# Patient Record
Sex: Male | Born: 1955 | Marital: Married | Smoking: Never smoker
Health system: Southern US, Community
[De-identification: ages and names within clinical notes are randomized; demographics above are authoritative.]

## PROBLEM LIST (undated history)

## (undated) DIAGNOSIS — I10 Essential (primary) hypertension: Secondary | ICD-10-CM

## (undated) HISTORY — PX: OTHER SURGICAL HISTORY: SHX169

---

## 2011-08-18 ENCOUNTER — Ambulatory Visit: Payer: Self-pay | Admitting: Internal Medicine

## 2012-11-26 IMAGING — CR DG CHEST 2V
1 series · 2 of 2 positions shown · non-contrast
Comparison: none

REASON FOR EXAM: posterior thoracic pain
COMMENTS:   LMP: (Male)

PROCEDURE:     MDR - MDR CHEST PA(OR AP) AND LATERAL  - August 18, 2011 [DATE]
RESULT:     The lungs are clear. The cardiac silhouette and visualized bony
skeleton are unremarkable.

[Series 1: pa · 0.17mm/px · 2 of 2 slices shown]
[im 1/2]
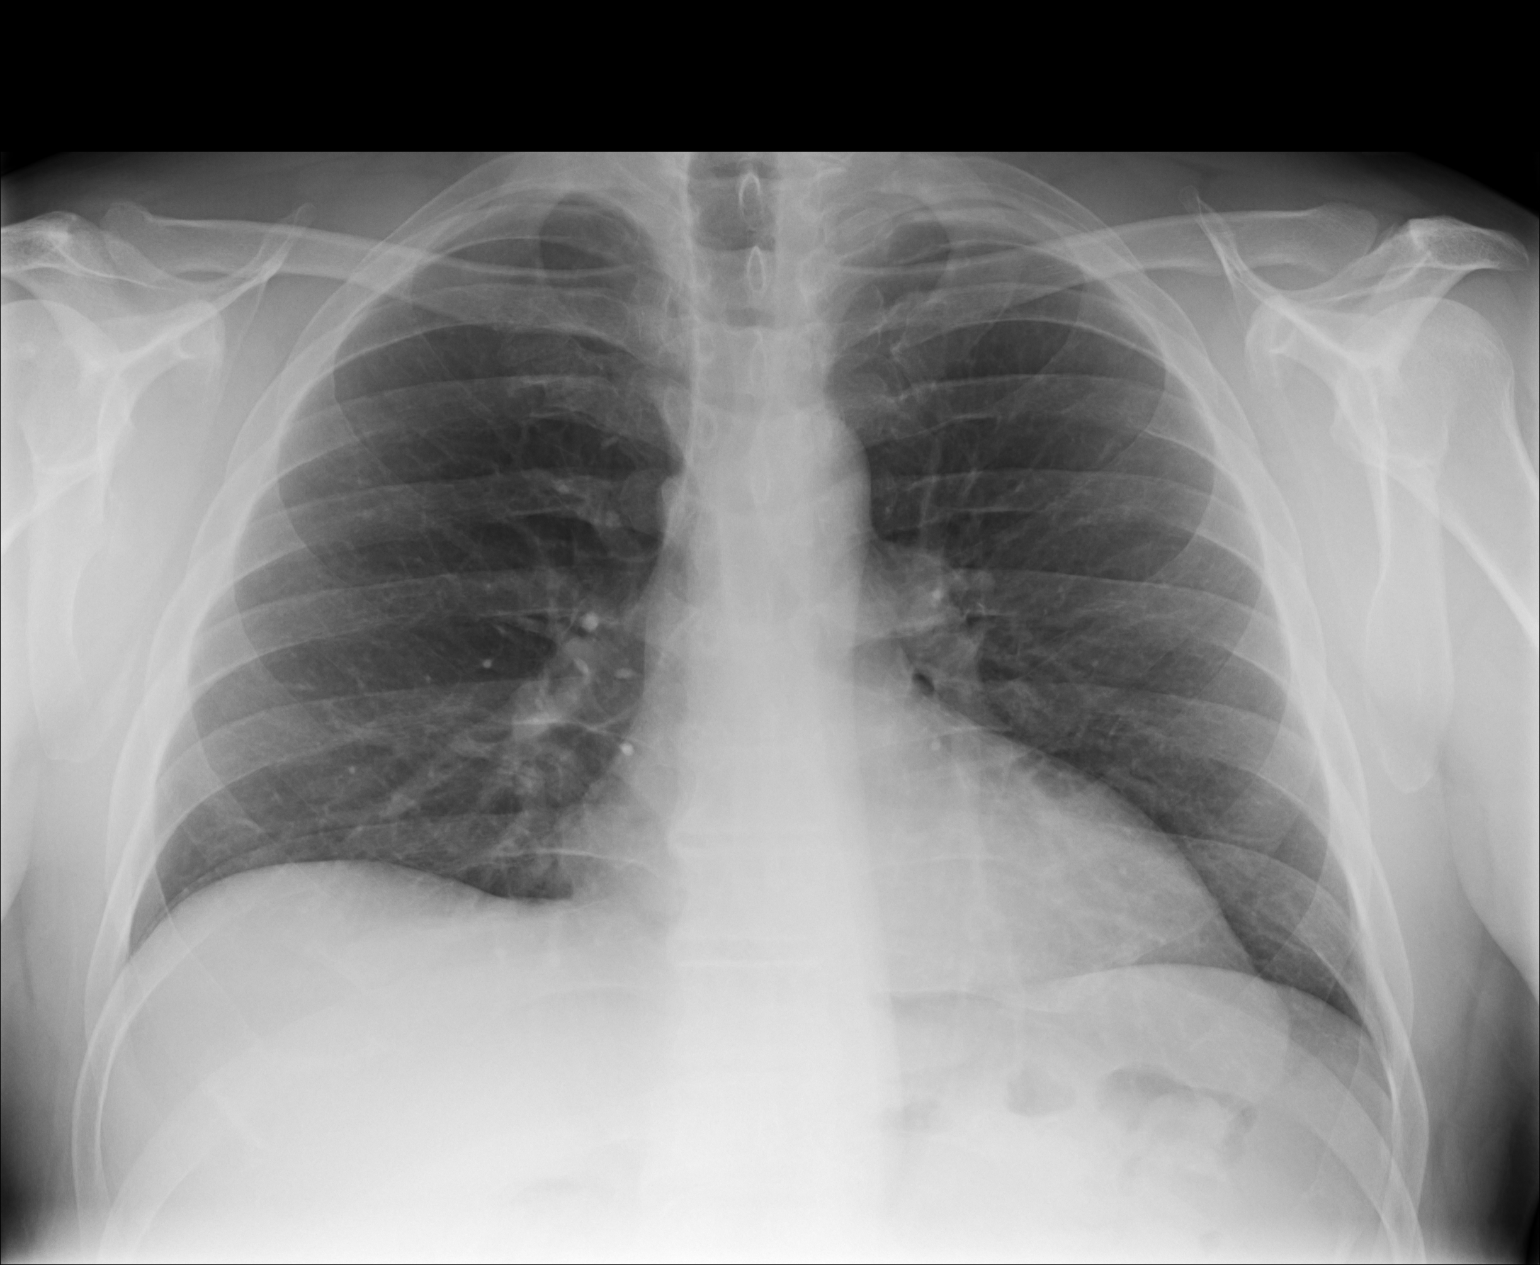
[im 2/2]
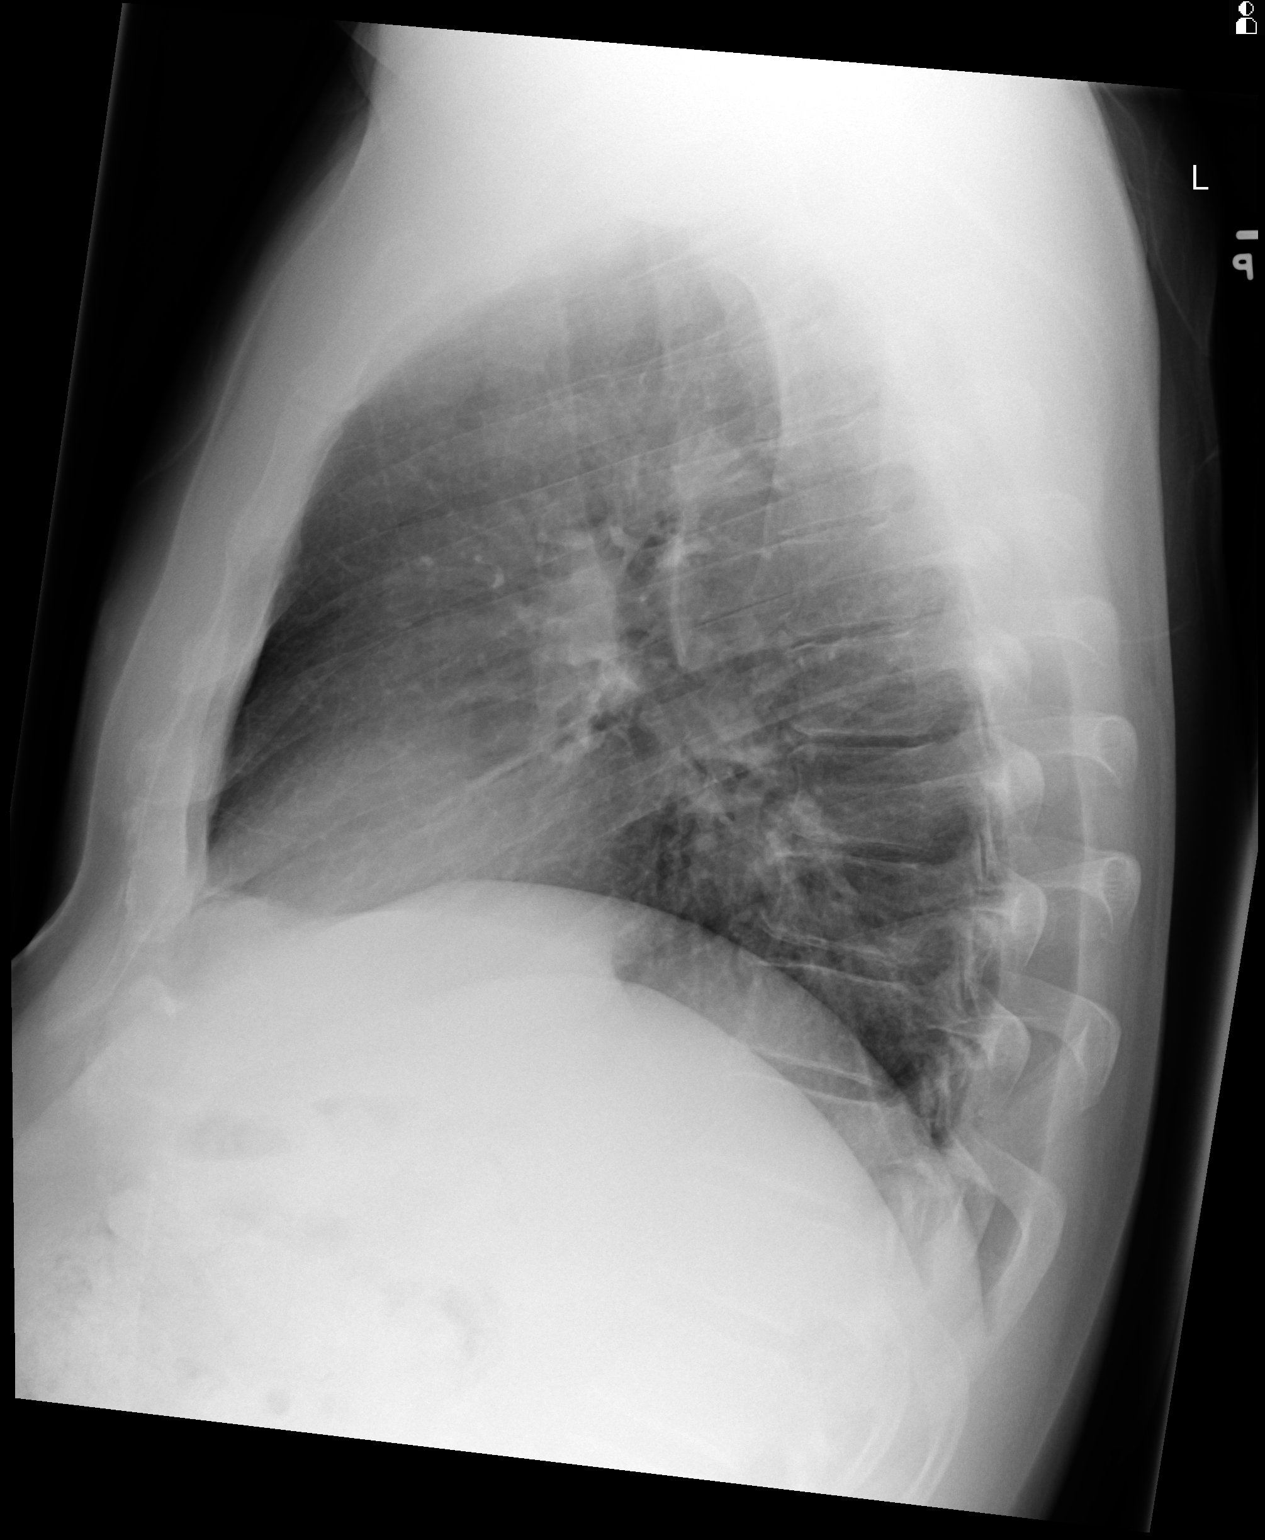

[2 of 2 positions shown; findings below may reference images not displayed]

IMPRESSION: 1. Chest radiograph without evidence of acute cardiopulmonary disease.

## 2014-12-18 ENCOUNTER — Ambulatory Visit
Admission: EM | Admit: 2014-12-18 | Discharge: 2014-12-18 | Disposition: A | Discharge status: Home / Self Care | Payer: Medicare FFS | Attending: Family Medicine | Admitting: Family Medicine

## 2014-12-18 ENCOUNTER — Encounter: Payer: Self-pay | Admitting: *Deleted

## 2014-12-18 DIAGNOSIS — J0110 Acute frontal sinusitis, unspecified: Secondary | ICD-10-CM

## 2014-12-18 MED ORDER — PREDNISONE 20 MG PO TABS: 40.0000 mg | ORAL_TABLET | Freq: Every day | ORAL | Status: AC

## 2014-12-18 MED ORDER — AMOXICILLIN-POT CLAVULANATE 875-125 MG PO TABS: 1.0000 | ORAL_TABLET | Freq: Two times a day (BID) | ORAL | Status: DC

## 2014-12-18 MED ORDER — FLUTICASONE PROPIONATE 50 MCG/ACT NA SUSP: 2.0000 | Freq: Every day | NASAL | Status: AC

## 2014-12-18 NOTE — Discharge Instructions (Signed)
Follow up with your primary care physician closely as discussed. Return to Urgent care as needed for new or worsening concerns.   Sinusitis, Adult Sinusitis is redness, soreness, and inflammation of the paranasal sinuses. Paranasal sinuses are air pockets within the bones of your face. They are located beneath your eyes, in the middle of your forehead, and above your eyes. In healthy paranasal sinuses, mucus is able to drain out, and air is able to circulate through them by way of your nose. However, when your paranasal sinuses are inflamed, mucus and air can become trapped. This can allow bacteria and other germs to grow and cause infection. Sinusitis can develop quickly and last only a short time (acute) or continue over a long period (chronic). Sinusitis that lasts for more than 12 weeks is considered chronic. CAUSES Causes of sinusitis include:  Allergies.  Structural abnormalities, such as displacement of the cartilage that separates your nostrils (deviated septum), which can decrease the air flow through your nose and sinuses and affect sinus drainage.  Functional abnormalities, such as when the small hairs (cilia) that line your sinuses and help remove mucus do not work properly or are not present. SIGNS AND SYMPTOMS Symptoms of acute and chronic sinusitis are the same. The primary symptoms are pain and pressure around the affected sinuses. Other symptoms include:  Upper toothache.  Earache.  Headache.  Bad breath.  Decreased sense of smell and taste.  A cough, which worsens when you are lying flat.  Fatigue.  Fever.  Thick drainage from your nose, which often is green and may contain pus (purulent).  Swelling and warmth over the affected sinuses. DIAGNOSIS Your health care provider will perform a physical exam. During your exam, your health care provider may perform any of the following to help determine if you have acute sinusitis or chronic sinusitis:  Look in your  nose for signs of abnormal growths in your nostrils (nasal polyps).  Tap over the affected sinus to check for signs of infection.  View the inside of your sinuses using an imaging device that has a light attached (endoscope). If your health care provider suspects that you have chronic sinusitis, one or more of the following tests may be recommended:  Allergy tests.  Nasal culture. A sample of mucus is taken from your nose, sent to a lab, and screened for bacteria.  Nasal cytology. A sample of mucus is taken from your nose and examined by your health care provider to determine if your sinusitis is related to an allergy. TREATMENT Most cases of acute sinusitis are related to a viral infection and will resolve on their own within 10 days. Sometimes, medicines are prescribed to help relieve symptoms of both acute and chronic sinusitis. These may include pain medicines, decongestants, nasal steroid sprays, or saline sprays. However, for sinusitis related to a bacterial infection, your health care provider will prescribe antibiotic medicines. These are medicines that will help kill the bacteria causing the infection. Rarely, sinusitis is caused by a fungal infection. In these cases, your health care provider will prescribe antifungal medicine. For some cases of chronic sinusitis, surgery is needed. Generally, these are cases in which sinusitis recurs more than 3 times per year, despite other treatments. HOME CARE INSTRUCTIONS  Drink plenty of water. Water helps thin the mucus so your sinuses can drain more easily.  Use a humidifier.  Inhale steam 3-4 times a day (for example, sit in the bathroom with the shower running).  Apply a warm, moist  washcloth to your face 3-4 times a day, or as directed by your health care provider.  Use saline nasal sprays to help moisten and clean your sinuses.  Take medicines only as directed by your health care provider.  If you were prescribed either an  antibiotic or antifungal medicine, finish it all even if you start to feel better. SEEK IMMEDIATE MEDICAL CARE IF:  You have increasing pain or severe headaches.  You have nausea, vomiting, or drowsiness.  You have swelling around your face.  You have vision problems.  You have a stiff neck.  You have difficulty breathing.   This information is not intended to replace advice given to you by your health care provider. Make sure you discuss any questions you have with your health care provider.   Document Released: 01/13/2005 Document Revised: 02/03/2014 Document Reviewed: 01/28/2011 Elsevier Interactive Patient Education [employer]

## 2014-12-18 NOTE — ED Provider Notes (Signed)
[facility]  ____________________________________________  Time seen: Approximately 10:39 AM  I have reviewed the triage vital signs and the nursing notes.   HISTORY  Chief Complaint Facial Pain   HPI Darrell Dyer is a 59 y.o. male presents for the complaints of 2-3 weeks of runny nose, nasal congestion, sinus pressure and intermittent cough. Patient reports over the last 2 weeks the runny nose has improved but sinus congestion has continued. States also with feelings that ears are congested and left ear aching sensation. States current sinus pressure pain is aching at 5/10. States over the counter ibuprofen improves pain but congestion continues. Patient also reports feels more tired than normal. States occasionally with a low-grade fever, states did not check with a thermometer. States frequently blows nose and gets thick greenish mucous out. States "symptoms are not that bad but are nagging." Reports continues to eat and drink well. Denies neck or back pain. Denies fall or injury. States has remained active.   Denies chest pain, shortness of breath, abdominal pain, vision changes, weakness, dizziness, leg swelling or rash.    History reviewed. No pertinent past medical history.  There are no active problems to display for this patient.  PCP: Graciela Husbands  Past Surgical History  Procedure Laterality Date  . Right knee arhoscopy Right     Current Outpatient Rx  Name  Route  Sig  Dispense  Refill  . losartan-hydrochlorothiazide (HYZAAR) 100-12.5 MG tablet   Oral   Take 1 tablet by mouth daily.         .           .           .             Allergies Review of patient's allergies indicates no known allergies.  Family History  Problem Relation Age of Onset  . Cancer Father     Social History Social History  Substance Use Topics  . Smoking status: Never Smoker   . Smokeless tobacco: None  . Alcohol Use: No    Review of Systems Constitutional: reports  possible fever at home.  Eyes: No visual changes. ENT: No sore throat. Positive runny nose, congestion, and sinus pressure. Positive ear discomfort.  Cardiovascular: Denies chest pain. Respiratory: Denies shortness of breath. Gastrointestinal: No abdominal pain.  No nausea, no vomiting.  No diarrhea.  No constipation. Genitourinary: Negative for dysuria. Musculoskeletal: Negative for back pain. Skin: Negative for rash. Neurological: Negative for focal weakness or numbness.  10-point ROS otherwise negative.  ____________________________________________   PHYSICAL EXAM:  VITAL SIGNS: ED Triage Vitals  Enc Vitals Group     BP 12/18/14 1010 111/79 mmHg     Pulse Rate 12/18/14 1010 77     Resp 12/18/14 1010 20     Temp 12/18/14 1010 98.2 F (36.8 C)     Temp Source 12/18/14 1010 Oral     SpO2 12/18/14 1010 97 %     Weight 12/18/14 1010 215 lb (97.523 kg)     Height 12/18/14 1010  (1.753 m)     Head Cir --      Peak Flow --      Pain Score 12/18/14 1013 7     Pain Loc --      Pain Edu? --      Excl. in GC? --     Constitutional: Alert and oriented. Well appearing and in no acute distress. Eyes: Conjunctivae are normal. PERRL. EOMI. Head: Atraumatic.mod  TTP bilateral frontal sinuses. Minimal TTP bilateral maxillary sinuses. No swelling or erythema. nontender over temporal arteries.   Ears: no erythema, mild air fluid levels visualized at bilateral TMs, otherwise normal TMs bilaterally. TMs intact.  Nontender. No exudate or  drainage.   Nose: nasal congestion, bilateral nasal turbinate erythema. Nares patent.   Mouth/Throat: Mucous membranes are moist.  Oropharynx non-erythematous.No tonsillar swelling or exudate.  Neck: No stridor.  No cervical spine tenderness to palpation. Hematological/Lymphatic/Immunilogical: No cervical lymphadenopathy. Cardiovascular: Normal rate, regular rhythm. Grossly normal heart sounds.  Good peripheral circulation. Respiratory: Normal  respiratory effort.  No retractions. Lungs CTAB.No wheezes, rales or rhonchi. Good air movement.  Gastrointestinal: Soft and nontender. No distention. Normal Bowel sounds.  No abdominal bruits. No CVA tenderness. Musculoskeletal: No lower or upper extremity tenderness nor edema.  No joint effusions. Bilateral pedal pulses equal and easily palpated. No cervical, thoracic or lumbar TTP, Full ROM.  Neurologic:  Normal speech and language. No gross focal neurologic deficits are appreciated. No gait instability. Skin:  Skin is warm, dry and intact. No rash noted. Psychiatric: Mood and affect are normal. Speech and behavior are normal.  ____________________________________________   LABS (all labs ordered are listed, but only abnormal results are displayed)  Labs Reviewed - No data to display   INITIAL IMPRESSION / ASSESSMENT AND PLAN / ED COURSE  Pertinent labs & imaging results that were available during my care of the patient were reviewed by me and considered in my medical decision making (see chart for details).  Very well appearing. No acute distress. Presents for 2-3 weeks of runny nose, nasal congestion, sinus pressure, cough, and ear discomfort. Lungs clear throughout, abdomen soft and nontender. Will treat frontal sinusitis with oral Augmentin, 3 day course prednisone, flonase and supportive treatments and close PCP follow up.   Discussed follow up with Primary care physician this week. Discussed follow up and return parameters including no resolution or any worsening concerns. Patient verbalized understanding and agreed to plan.   ____________________________________________   FINAL CLINICAL IMPRESSION(S) / ED DIAGNOSES  Final diagnoses:  Acute frontal sinusitis, recurrence not specified       Renford DillsLindsey Rehanna Oloughlin, NP 12/18/14 1054

## 2014-12-18 NOTE — ED Notes (Signed)
Sinus headache, non productive cough, and general malaise x 3 weeks.  No sinus drainage or sore throat.

## 2017-04-07 ENCOUNTER — Other Ambulatory Visit: Payer: Self-pay

## 2017-04-07 ENCOUNTER — Ambulatory Visit
Admission: EM | Admit: 2017-04-07 | Discharge: 2017-04-07 | Disposition: A | Discharge status: Other Acute Inpatient Hospital | Payer: 59 | Attending: Family Medicine | Admitting: Family Medicine

## 2017-04-07 DIAGNOSIS — R079 Chest pain, unspecified: Secondary | ICD-10-CM | POA: Diagnosis not present

## 2017-04-07 DIAGNOSIS — Z79899 Other long term (current) drug therapy: Secondary | ICD-10-CM | POA: Insufficient documentation

## 2017-04-07 DIAGNOSIS — Z809 Family history of malignant neoplasm, unspecified: Secondary | ICD-10-CM | POA: Insufficient documentation

## 2017-04-07 DIAGNOSIS — I10 Essential (primary) hypertension: Secondary | ICD-10-CM | POA: Diagnosis present

## 2017-04-07 DIAGNOSIS — Z9889 Other specified postprocedural states: Secondary | ICD-10-CM | POA: Diagnosis not present

## 2017-04-07 HISTORY — DX: Essential (primary) hypertension: I10

## 2017-04-07 MED ORDER — ASPIRIN 81 MG PO CHEW
324.0000 mg | CHEWABLE_TABLET | Freq: Once | ORAL | Status: AC
  Administered 2017-04-07: 324 mg via ORAL

## 2017-04-07 NOTE — ED Provider Notes (Signed)
[facility]    CSN: [id number] Arrival date & time: 04/07/17  0845     History   Chief Complaint Chief Complaint  Patient presents with  . Hypertension  . Chest Pain    HPI Darrell Dyer is a 62 y.o. male.   HPI  62 year old male presents with the onset this morning of elevated blood pressure that "spiked", clammy feeling and chest pain which is substernal radiating into his throat.  He states that he feels hard to swallow for the past 3 days.  The pain, when it happens, will increase to a 5-6 out of 10.  The pain has been happening every few minutes today lasting 10 seconds up to a minute.  He does admit to taking Cialis 20 mg on Friday and again on Saturday- is wondering if this may be the reason.  No significant family history of coronary disease.  Never smoked.  Does have hypertension taking Hyzaar daily.  States he exercised for 45 minutes on the treadmill yesterday and did have pain during the exercise itself.  Over these past 3 days the pain has worsened to the point that he needed to come in today.        Past Medical History:  Diagnosis Date  . Hypertension     There are no active problems to display for this patient.   Past Surgical History:  Procedure Laterality Date  . right knee arhoscopy Right        Home Medications    Prior to Admission medications   Medication Sig Start Date End Date Taking? Authorizing Provider  fluticasone (FLONASE) 50 MCG/ACT nasal spray Place 2 sprays into both nostrils daily. 12/18/14 01/01/15  Renford Dills, NP  losartan-hydrochlorothiazide (HYZAAR) 100-12.5 MG tablet Take 1 tablet by mouth daily.    [provider]    Family History Family History  Problem Relation Age of Onset  . Cancer Father     Social History Social History   Tobacco Use  . Smoking status: Never Smoker  . Smokeless tobacco: Never Used  Substance Use Topics  . Alcohol use: Yes    Comment: rare  . Drug use: No      Allergies   Patient has no known allergies.   Review of Systems Review of Systems  Constitutional: Positive for activity change. Negative for chills, fatigue and fever.  Respiratory: Positive for shortness of breath.   Cardiovascular: Positive for chest pain.  All other systems reviewed and are negative.    Physical Exam Triage Vital Signs ED Triage Vitals  Enc Vitals Group     BP 04/07/17 0856 (!) 143/87     Pulse Rate 04/07/17 0856 77     Resp 04/07/17 0856 18     Temp 04/07/17 0856 98 F (36.7 C)     Temp Source 04/07/17 0856 Oral     SpO2 04/07/17 0856 97 %     Weight 04/07/17 0858 210 lb (95.3 kg)     Height 04/07/17 0858 5\' 9"  (1.753 m)     Head Circumference --      Peak Flow --      Pain Score 04/07/17 0858 1     Pain Loc --      Pain Edu? --      Excl. in GC? --    No data found.  Updated Vital Signs BP (!) 143/87 (BP Location: Left Arm)   Pulse 77   Temp 98 F (36.7 C) (Oral)  Resp 18   Ht 5\' 9"  (1.753 m)   Wt 210 lb (95.3 kg)   SpO2 97%   BMI 31.01 kg/m   Visual Acuity Right Eye Distance:   Left Eye Distance:   Bilateral Distance:    Right Eye Near:   Left Eye Near:    Bilateral Near:     Physical Exam  Constitutional: He is oriented to person, place, and time. He appears well-developed and well-nourished.  Non-toxic appearance. He does not appear ill. No distress.  HENT:  Head: Normocephalic.  Eyes: Pupils are equal, round, and reactive to light.  Neck: Normal range of motion.  Cardiovascular: Normal rate and regular rhythm.  No extrasystoles are present. Exam reveals no gallop.  No murmur heard. Pulmonary/Chest: Effort normal and breath sounds normal.  Musculoskeletal: Normal range of motion.  Neurological: He is alert and oriented to person, place, and time.  Skin: Skin is warm and dry.  Psychiatric: His mood appears not anxious. He is not agitated.  Nursing note and vitals reviewed.    UC Treatments / Results   Labs (all labs ordered are listed, but only abnormal results are displayed) Labs Reviewed - No data to display  EKG ED ECG REPORT   Date: 04/07/2017  EKG Time: 9:40 AM  Rate: 69  Rhythm: normal sinus rhythm,   Axis:normal  Intervals:none  ST&T Change: Flipped T waves in 3 and V1  Narrative InterpretationSinus rhythm without acute changes           Radiology No results found.  Procedures Procedures (including critical care time)  Medications Ordered in UC Medications  aspirin chewable tablet 324 mg (324 mg Oral Given 04/07/17 [id number])     Initial Impression / Assessment and Plan / UC Course  I have reviewed the triage vital signs and the nursing notes.  Pertinent labs & imaging results that were available during my care of the patient were reviewed by me and considered in my medical decision making (see chart for details).     Had a discussion with the patient along with Dr. Adriana Simasook who  also saw the patient.  We have told him that is very concerning with his symptoms despite a normal EKG finding today.  Recommended that he be seen in the emergency room.  The patient is reluctant to go.  [facility] transportation.  He has signed an AMA form regarding this. Prior To his leaving he was given 4 baby aspirins chewable.  Left our facility in stable condition and was stating that he would go to [facility] from here.  Final Clinical Impressions(s) / UC Diagnoses   Final diagnoses:  Chest pain, unspecified type    ED Discharge Orders    None       Controlled Substance Prescriptions [place] Controlled Substance Registry consulted? Not Applicable   Lutricia FeilRoemer, Hanif Radin P, PA-C 04/07/17 [id number]

## 2017-04-07 NOTE — ED Triage Notes (Addendum)
Pt reports this morning his blood pressure spiked and he felt clammy. Has had chest pain (centra) and radiating into throat and feels hard to swallow. x past 3 days. Pain 1/10

## 2021-10-23 ENCOUNTER — Ambulatory Visit (INDEPENDENT_AMBULATORY_CARE_PROVIDER_SITE_OTHER): Payer: 59

## 2021-10-23 ENCOUNTER — Encounter: Payer: Self-pay | Admitting: Emergency Medicine

## 2021-10-23 ENCOUNTER — Ambulatory Visit
Admission: EM | Admit: 2021-10-23 | Discharge: 2021-10-23 | Disposition: A | Discharge status: Home / Self Care | Payer: 59 | Attending: Physician Assistant | Admitting: Physician Assistant

## 2021-10-23 DIAGNOSIS — R079 Chest pain, unspecified: Secondary | ICD-10-CM | POA: Diagnosis not present

## 2021-10-23 DIAGNOSIS — R509 Fever, unspecified: Secondary | ICD-10-CM | POA: Insufficient documentation

## 2021-10-23 DIAGNOSIS — Z20822 Contact with and (suspected) exposure to covid-19: Secondary | ICD-10-CM | POA: Insufficient documentation

## 2021-10-23 DIAGNOSIS — R52 Pain, unspecified: Secondary | ICD-10-CM | POA: Diagnosis not present

## 2021-10-23 DIAGNOSIS — R6883 Chills (without fever): Secondary | ICD-10-CM

## 2021-10-23 DIAGNOSIS — R0789 Other chest pain: Secondary | ICD-10-CM | POA: Diagnosis present

## 2021-10-23 LAB — RESP PANEL BY RT-PCR (FLU A&B, COVID) ARPGX2
Influenza A by PCR: NEGATIVE
Influenza B by PCR: NEGATIVE
SARS Coronavirus 2 by RT PCR: NEGATIVE

## 2021-10-23 MED ORDER — ACETAMINOPHEN 325 MG PO TABS
650.0000 mg | ORAL_TABLET | Freq: Once | ORAL | Status: AC
  Administered 2021-10-23: 650 mg via ORAL

## 2021-10-23 NOTE — Discharge Instructions (Addendum)
-  Your flu and COVID test are negative today but that can change since your symptoms just started.  Retake a COVID test if you develop cough or in 2 days.  If you are positive need to isolate 5 days from symptom onset and wear mask for 5 days. - Your chest x-ray is normal.  No sign of pneumonia. - Your EKG shows a fast-paced heart rate, otherwise it is normal. - Try to rest and increase your fluid intake.  We gave you Tylenol at 1:00 today for the low-grade fever. - If you develop any acute worsening of symptoms, please consider reevaluation.  If you develop return of the chest pain, shortness of breath, weakness, abdominal pain, urinary symptoms, etc., consider reexamination.  Go to ER for any acute worsening of her symptoms.

## 2021-10-23 NOTE — ED Provider Notes (Signed)
[facility]    CSN: [id number] Arrival date & time: 10/23/21  1223      History   Chief Complaint Chief Complaint  Patient presents with   Hypertension   Chest Pain   Back Pain    HPI Darrell Dyer is a 66 y.o. male presenting for sudden onset of shaking chills, body aches, chest tightness, back tightness, headache and fatigue immediately prior to arrival to urgent care.  Patient reports that he was going to the drive-through to get some food and he experienced all the symptoms.  He reports he checked his blood pressure and it was very high.  He thought he would come and get checked out immediately.  He says that he has not had any cough, congestion, sore throat, breathing difficulty, chest pain on breathing, abdominal pain, nausea/vomiting/diarrhea, constipation, dysuria, urinary frequency urgency, flank pain.  Patient has a history of hypertension and other medical history significant for 8 to 54-month history of chronic neck pain.  He is following up with physical therapy about this.  Patient denies any sick contacts.  No history of MI or stroke per his medical record.  HPI  Past Medical History:  Diagnosis Date   Hypertension     There are no problems to display for this patient.   Past Surgical History:  Procedure Laterality Date   right knee arhoscopy Right        Home Medications    Prior to Admission medications   Medication Sig Start Date End Date Taking? Authorizing Provider  tadalafil (CIALIS) 20 MG tablet 1/2 TO 1 TABLET BY MOUTH AS NEEDED. TAKE 30 MINUTES PRIOR TO SEX 11/13/16  Yes [provider]  atorvastatin (LIPITOR) 80 MG tablet Take 80 mg by mouth daily. 10/11/21   [provider]  fluticasone (FLONASE) 50 MCG/ACT nasal spray Place 2 sprays into both nostrils daily. 12/18/14 01/01/15  Renford Dills, NP  losartan-hydrochlorothiazide (HYZAAR) 100-12.5 MG tablet Take 1 tablet by mouth daily.    [provider]     Family History Family History  Problem Relation Age of Onset   Cancer Father     Social History Social History   Tobacco Use   Smoking status: Never   Smokeless tobacco: Never  Vaping Use   Vaping Use: Never used  Substance Use Topics   Alcohol use: Yes    Comment: rare   Drug use: No     Allergies   Patient has no known allergies.   Review of Systems Review of Systems  Constitutional:  Positive for chills and fatigue. Negative for fever.  HENT:  Negative for congestion, rhinorrhea, sinus pressure, sinus pain and sore throat.   Respiratory:  Positive for chest tightness. Negative for cough and shortness of breath.   Cardiovascular:  Negative for chest pain and palpitations.  Gastrointestinal:  Negative for abdominal pain, diarrhea, nausea and vomiting.  Genitourinary:  Negative for difficulty urinating, dysuria, flank pain and frequency.  Musculoskeletal:  Positive for back pain, myalgias and neck pain (chronic).  Neurological:  Positive for headaches. Negative for dizziness, weakness and light-headedness.  Hematological:  Negative for adenopathy.     Physical Exam Triage Vital Signs ED Triage Vitals [10/23/21 1227]  Enc Vitals Group     BP (!) 143/104     Pulse Rate 93     Resp 20     Temp 100 F (37.8 C)     Temp Source Oral     SpO2 99 %  Weight      Height      Head Circumference      Peak Flow      Pain Score 0     Pain Loc      Pain Edu?      Excl. in GC?    No data found.  Updated Vital Signs BP (!) 143/104 (BP Location: Left Arm)   Pulse 93   Temp 100 F (37.8 C) (Oral)   Resp 20   SpO2 99%     Physical Exam Vitals and nursing note reviewed.  Constitutional:      General: He is not in acute distress.    Appearance: Normal appearance. He is well-developed. He is ill-appearing.     Comments: Patient has shaking chills  HENT:     Head: Normocephalic and atraumatic.     Nose: Nose normal.     Mouth/Throat:     Mouth: Mucous  membranes are moist.     Pharynx: Oropharynx is clear.  Eyes:     General: No scleral icterus.    Conjunctiva/sclera: Conjunctivae normal.  Cardiovascular:     Rate and Rhythm: Regular rhythm. Tachycardia present.     Heart sounds: Normal heart sounds.  Pulmonary:     Effort: Pulmonary effort is normal. No respiratory distress.     Breath sounds: Normal breath sounds.  Abdominal:     Palpations: Abdomen is soft.     Tenderness: There is no abdominal tenderness. There is no right CVA tenderness or left CVA tenderness.  Musculoskeletal:     Cervical back: Neck supple.  Skin:    General: Skin is warm and dry.     Capillary Refill: Capillary refill takes less than 2 seconds.  Neurological:     General: No focal deficit present.     Mental Status: He is alert and oriented to person, place, and time. Mental status is at baseline.     Motor: No weakness.     Gait: Gait normal.  Psychiatric:        Mood and Affect: Mood normal.        Behavior: Behavior normal.      UC Treatments / Results  Labs (all labs ordered are listed, but only abnormal results are displayed) Labs Reviewed  RESP PANEL BY RT-PCR (FLU A&B, COVID) [id number]    EKG   Radiology DG Chest 2 View  Result Date: 10/23/2021 CLINICAL DATA:  Fever, chest pain EXAM: CHEST - 2 VIEW COMPARISON:  08/18/2011 FINDINGS: Transverse diameter of heart is slightly increased. There are no signs of pulmonary edema or focal pulmonary consolidation. There is no pleural effusion or pneumothorax. No significant interval changes are noted. IMPRESSION: No active cardiopulmonary disease. Electronically Signed   By: Ernie Avena M.D.   On: 10/23/2021 12:58    Procedures ED EKG  Date/Time: 10/23/2021 1:19 PM  Performed by: Shirlee Latch, PA-C Authorized by: Shirlee Latch, PA-C   Previous ECG:    Previous ECG:  Unavailable Interpretation:    Interpretation: abnormal     Details:  Significant artifact/tremors. Possible RV  conduction delay Rate:    ECG rate:  82 Rhythm:    Rhythm: sinus rhythm   Ectopy:    Ectopy: none   QRS:    QRS axis:  Normal   QRS intervals:  Normal   QRS conduction: non-specific conduction delay   ST segments:    ST segments:  Normal T waves:    T waves: normal  Comments:     Normal sinus rhythm, significant tremors and artifact.  Possible right ventricular conduction delay ED EKG  Date/Time: 10/23/2021 1:25 PM  Performed by: Danton Clap, PA-C Authorized by: Margarette Canada, NP   Previous ECG:    Previous ECG:  Unavailable Interpretation:    Interpretation: normal   Rate:    ECG rate:  99 Rhythm:    Rhythm: sinus rhythm   Ectopy:    Ectopy: none   QRS:    QRS axis:  Normal   QRS intervals:  Normal   QRS conduction: normal   ST segments:    ST segments:  Normal T waves:    T waves: normal   Comments:     Normal sinus rhythm. Regular rate.   (including critical care time)  Medications Ordered in UC Medications  acetaminophen (TYLENOL) tablet 650 mg (650 mg Oral Given 10/23/21 1248)    Initial Impression / Assessment and Plan / UC Course  I have reviewed the triage vital signs and the nursing notes.  Pertinent labs & imaging results that were available during my care of the patient were reviewed by me and considered in my medical decision making (see chart for details).   66 year old male presenting for sudden onset of chills, shaking tremors, fatigue, body aches, chest tightness and back tightness, headache immediately prior to arrival to urgent care.  Patient says since he has been here he no longer has any chest tightness and just feels overall body aches.  He denies any cough, ingestion, sore throat, urinary symptoms, GI symptoms, shortness of breath, pleuritic pain.  No sick contacts.  BP elevated at 143/104.  Patient is mildly ill-appearing.  He does have shaking chills and tremors that are generalized.  Normal HEENT exam.  He is alert and oriented x3.   Chest clear to auscultation heart regular rate and rhythm.  EKG performed today shows normal sinus rhythm with a lot of muscle tremors and artifact.  Possible right ventricular conduction delay.  We will repeat EKG.  Chest x-ray performed is normal.  Respiratory panel performed to assess for flu and COVID.  Given 650 mg Tylenol for the low-grade fever to see if that helps with his chills and tremors.  Respiratory panel is negative for flu and COVID.  Repeat EKG is much better.  Shows normal sinus rhythm and a rate of 99 bpm.  No artifact or tremors noted.  Discussed results with patient.  Patient's symptoms just started so I expect that he will develop some other symptoms in the course of this illness but it does seem to be consistent with viral illness, could still be flu or COVID and I discussed this with him.  Advised him to retest himself for COVID if he develops upper respiratory symptoms or in 2 days.  Reviewed current CDC guidelines, isolation read about ED precautions if COVID-positive.  Thoroughly reviewed that he should seek reevaluation if he develops any acute worsening of symptoms especially if he has any abdominal pain, vomiting, weakness, return of the chest pain, shortness of breath, urinary symptoms, etc.  At this time his symptoms are fairly vague and other symptoms may develop.  This would warrant reexamination.  At this time he should rest increase fluids and take over-the-counter Tylenol and/or Motrin as needed for fever control and symptoms.  Continue home medication.  Final Clinical Impressions(s) / UC Diagnoses   Final diagnoses:  Shaking chills  Low grade fever  Body aches  Chest tightness  Discharge Instructions      -Your flu and COVID test are negative today but that can change since your symptoms just started.  Retake a COVID test if you develop cough or in 2 days.  If you are positive need to isolate 5 days from symptom onset and wear mask for 5  days. - Your chest x-ray is normal.  No sign of pneumonia. - Your EKG shows a fast-paced heart rate, otherwise it is normal. - Try to rest and increase your fluid intake.  We gave you Tylenol at 1:00 today for the low-grade fever. - If you develop any acute worsening of symptoms, please consider reevaluation.  If you develop return of the chest pain, shortness of breath, weakness, abdominal pain, urinary symptoms, etc., consider reexamination.  Go to ER for any acute worsening of her symptoms.     ED Prescriptions   None    PDMP not reviewed this encounter.   Shirlee Latch, PA-C 10/23/21 1404

## 2021-10-23 NOTE — ED Triage Notes (Signed)
Pt presents to the urgent care stating he cant stop shaking. He was on his way back from his lunch break and started to have chills. He took his BP and it was elevated. He has center chest tightness and a tightness in his back.
